# Patient Record
Sex: Male | Born: 1990 | Race: White | Hispanic: No | Marital: Single | Smoking: Never smoker
Health system: Southern US, Community
[De-identification: ages and names within clinical notes are randomized; demographics above are authoritative.]

---
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
## 2013-01-19 ENCOUNTER — Encounter: Payer: Self-pay | Admitting: Family Medicine

## 2013-01-19 ENCOUNTER — Ambulatory Visit (INDEPENDENT_AMBULATORY_CARE_PROVIDER_SITE_OTHER): Payer: 59 | Admitting: Family Medicine

## 2013-01-19 VITALS — BP 120/76 | HR 72 | Temp 98.5°F | Resp 18 | Ht 68.0 in | Wt 200.0 lb

## 2013-01-19 DIAGNOSIS — Z0000 Encounter for general adult medical examination without abnormal findings: Secondary | ICD-10-CM

## 2013-01-19 DIAGNOSIS — Z23 Encounter for immunization: Secondary | ICD-10-CM

## 2013-01-19 NOTE — Progress Notes (Signed)
  Subjective:    Patient ID: Ricky Miller, male    DOB: 1991-06-18, 22 y.o.   MRN: [id number]  HPI Patient seen to establish care. Generally very healthy. No chronic medical problems. Takes no medications. No known allergies. Immunizations reviewed. No history of meningitis vaccine. No documented hepatitis A vaccine.  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 No illicit drug use.  Family history significant for grandparent with type 2 diabetes otherwise unrevealing.  No past medical history on file. No past surgical history on file.  reports that he has never smoked. He has never used smokeless tobacco. He reports that he does not drink alcohol or use illicit drugs. family history is not on file. No Known Allergies    Review of Systems  Constitutional: Negative for fever, activity change, appetite change and fatigue.  HENT: Negative for ear pain, congestion and trouble swallowing.   Eyes: Negative for pain and visual disturbance.  Respiratory: Negative for cough, shortness of breath and wheezing.   Cardiovascular: Negative for chest pain and palpitations.  Gastrointestinal: Negative for nausea, vomiting, abdominal pain, diarrhea, constipation, blood in stool, abdominal distention and rectal pain.  Genitourinary: Negative for dysuria, hematuria and testicular pain.  Musculoskeletal: Negative for joint swelling and arthralgias.  Skin: Negative for rash.  Neurological: Negative for dizziness, syncope and headaches.  Hematological: Negative for adenopathy.  Psychiatric/Behavioral: Negative for confusion and dysphoric mood.       Objective:   Physical Exam  Constitutional: He is oriented to person, place, and time. He appears well-developed and well-nourished. No distress.  HENT:  Head: Normocephalic and atraumatic.  Right Ear: External ear normal.  Left Ear: External ear normal.  Mouth/Throat: Oropharynx is clear and moist.  Eyes: Conjunctivae and EOM are normal.  Pupils are equal, round, and reactive to light.  Neck: Normal range of motion. Neck supple. No thyromegaly present.  Cardiovascular: Normal rate, regular rhythm and normal heart sounds.   No murmur heard. Pulmonary/Chest: No respiratory distress. He has no wheezes. He has no rales.  Abdominal: Soft. Bowel sounds are normal. He exhibits no distension and no mass. There is no tenderness. There is no rebound and no guarding.  Musculoskeletal: He exhibits no edema.  Lymphadenopathy:    He has no cervical adenopathy.  Neurological: He is alert and oriented to person, place, and time. He displays normal reflexes. No cranial nerve deficit.  Skin: No rash noted.  Patient has some scattered nevi on his trunk and has seen dermatologist in the past. None with any severe atypical features  Psychiatric: He has a normal mood and affect.          Assessment & Plan:  Healthy 22 year old male. Would recommend meningitis vaccine. Discussed HPV vaccine and he wishes to wait. Recommend consideration for hepatitis A vaccine especially for any future travel out of country

## 2015-09-29 DIAGNOSIS — H5203 Hypermetropia, bilateral: Secondary | ICD-10-CM | POA: Diagnosis not present

## 2015-09-29 DIAGNOSIS — H52223 Regular astigmatism, bilateral: Secondary | ICD-10-CM | POA: Diagnosis not present

## 2016-09-07 DIAGNOSIS — H52223 Regular astigmatism, bilateral: Secondary | ICD-10-CM | POA: Diagnosis not present

## 2016-09-07 DIAGNOSIS — H5203 Hypermetropia, bilateral: Secondary | ICD-10-CM | POA: Diagnosis not present

## 2017-04-12 DIAGNOSIS — S0502XA Injury of conjunctiva and corneal abrasion without foreign body, left eye, initial encounter: Secondary | ICD-10-CM | POA: Diagnosis not present

## 2017-04-12 MED FILL — TOBRAMYCIN 0.3% EYE DROPS: 0.3 | 5 days supply | Qty: 5 | Fill #0

## 2018-01-10 ENCOUNTER — Encounter: Payer: Self-pay | Admitting: Family Medicine

## 2018-01-10 ENCOUNTER — Ambulatory Visit (INDEPENDENT_AMBULATORY_CARE_PROVIDER_SITE_OTHER): Payer: No Typology Code available for payment source | Admitting: Family Medicine

## 2018-01-10 VITALS — BP 124/82 | HR 84 | Temp 98.3°F | Ht 68.25 in | Wt 218.7 lb

## 2018-01-10 DIAGNOSIS — Z23 Encounter for immunization: Secondary | ICD-10-CM

## 2018-01-10 DIAGNOSIS — Z0000 Encounter for general adult medical examination without abnormal findings: Secondary | ICD-10-CM | POA: Diagnosis not present

## 2018-01-10 LAB — BASIC METABOLIC PANEL
BUN: 11 mg/dL (ref 6–23)
CHLORIDE: 103 meq/L (ref 96–112)
CO2: 31 mEq/L (ref 19–32)
Calcium: 10.1 mg/dL (ref 8.4–10.5)
Creatinine, Ser: 1.08 mg/dL (ref 0.40–1.50)
GFR: 88.9 mL/min (ref >60.00)
Glucose, Bld: 101 mg/dL — ABNORMAL HIGH (ref 70–99)
POTASSIUM: 4.1 meq/L (ref 3.5–5.1)
Sodium: 143 mEq/L (ref 135–145)

## 2018-01-10 LAB — HEPATIC FUNCTION PANEL
ALT: 67 U/L — AB (ref 0–53)
AST: 23 U/L (ref 0–37)
Albumin: 5 g/dL (ref 3.5–5.2)
Alkaline Phosphatase: 76 U/L (ref 39–117)
BILIRUBIN DIRECT: 0.1 mg/dL (ref 0.0–0.3)
BILIRUBIN TOTAL: 0.5 mg/dL (ref 0.2–1.2)
TOTAL PROTEIN: 7.3 g/dL (ref 6.0–8.3)

## 2018-01-10 LAB — CBC WITH DIFFERENTIAL/PLATELET
BASOS ABS: 0 10*3/uL (ref 0.0–0.1)
BASOS PCT: 0.2 % (ref 0.0–3.0)
EOS ABS: 0.1 10*3/uL (ref 0.0–0.7)
Eosinophils Relative: 1.4 % (ref 0.0–5.0)
HEMATOCRIT: 43.3 % (ref 39.0–52.0)
Hemoglobin: 15.3 g/dL (ref 13.0–17.0)
LYMPHS ABS: 2.5 10*3/uL (ref 0.7–4.0)
LYMPHS PCT: 31.1 % (ref 12.0–46.0)
MCHC: 35.5 g/dL (ref 30.0–36.0)
MCV: 84 fl (ref 78.0–100.0)
Monocytes Absolute: 0.5 10*3/uL (ref 0.1–1.0)
Monocytes Relative: 5.6 % (ref 3.0–12.0)
NEUTROS ABS: 4.9 10*3/uL (ref 1.4–7.7)
NEUTROS PCT: 61.7 % (ref 43.0–77.0)
PLATELETS: 258 10*3/uL (ref 150.0–400.0)
RBC: 5.15 Mil/uL (ref 4.22–5.81)
RDW: 12.9 % (ref 11.5–15.5)
WBC: 8 10*3/uL (ref 4.0–10.5)

## 2018-01-10 LAB — LIPID PANEL
CHOL/HDL RATIO: 5
CHOLESTEROL: 201 mg/dL — AB (ref 0–200)
HDL: 37 mg/dL — ABNORMAL LOW (ref >39.00)
NonHDL: 163.54
TRIGLYCERIDES: 252 mg/dL — AB (ref 0.0–149.0)
VLDL: 50.4 mg/dL — AB (ref 0.0–40.0)

## 2018-01-10 LAB — LDL CHOLESTEROL, DIRECT: LDL DIRECT: 118 mg/dL

## 2018-01-10 LAB — TSH: TSH: 1.03 u[IU]/mL (ref 0.35–4.50)

## 2018-01-10 NOTE — Progress Notes (Signed)
Subjective:     Patient ID: Ricky Miller, male   DOB: October 28, 1990, 27 y.o.   MRN: [id number]  HPI Patient seen to establish care and requesting physical. He has plans to get married next June 6. He has no chronic medical problems. Takes no medications. Nonsmoker.  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 Nontender. Fluctuates up and down some in size.  Last tetanus over 10 years ago  Family history reviewed. Maternal grandfather with colon cancer.  Marland Kitchen.  His father has hypertension and hyperlipidemia. No family history of premature CAD  No past medical history on file.   reports that he has never smoked. He has never used smokeless tobacco. He reports that he does not drink alcohol or use drugs. family history includes Cancer in his maternal grandfather; Heart disease in his sister; Hyperlipidemia in his father; Hypertension in his father. No Known Allergies   Review of Systems  Constitutional: Negative for activity change, appetite change, fatigue and fever.  HENT: Negative for congestion, ear pain and trouble swallowing.   Eyes: Negative for pain and visual disturbance.  Respiratory: Negative for cough, shortness of breath and wheezing.   Cardiovascular: Negative for chest pain and palpitations.  Gastrointestinal: Negative for abdominal distention, abdominal pain, blood in stool, constipation, diarrhea, nausea, rectal pain and vomiting.  Genitourinary: Negative for dysuria, hematuria and testicular pain.  Musculoskeletal: Negative for arthralgias and joint swelling.  Skin: Negative for rash.  Neurological: Negative for dizziness, syncope and headaches.  Hematological: Negative for adenopathy.  Psychiatric/Behavioral: Negative for confusion and dysphoric mood.       Objective:   Physical Exam  Constitutional: He is oriented to person, place,  and time. He appears well-developed and well-nourished. No distress.  HENT:  Head: Normocephalic and atraumatic.  Right Ear: External ear normal.  Left Ear: External ear normal.  Mouth/Throat: Oropharynx is clear and moist.  Eyes: Pupils are equal, round, and reactive to light. Conjunctivae and EOM are normal.  Neck: Normal range of motion. Neck supple. No thyromegaly present.  Cardiovascular: Normal rate, regular rhythm and normal heart sounds.  No murmur heard. Pulmonary/Chest: No respiratory distress. He has no wheezes. He has no rales.  Abdominal: Soft. Bowel sounds are normal. He exhibits no distension and no mass. There is no tenderness. There is no rebound and no guarding.  Genitourinary:  Genitourinary Comments: Right testicle is normal. Left testicle is normal. Just superior to the testicle up in epididymis region there is approximately 1/2 cm mobile rounded cystic type lesion  Musculoskeletal: He exhibits no edema.  Lymphadenopathy:    He has no cervical adenopathy.  Neurological: He is alert and oriented to person, place, and time. He displays normal reflexes. No cranial nerve deficit.  Skin: No rash noted.  Psychiatric: He has a normal mood and affect.       Assessment:     Physical exam. Patient is generally healthy. He has very small cystic type lesion which is not attached to the left testicle and is up in epididymis region and may represent small epididymis cyst versus spermatocele    Plan:     -Tetanus booster given -Obtain screening lab work -Watch scrotal cystic lesion closely. If changing in size or any new things such as pain follow-up promptly for further evaluation  Ricky Miller W Ronasia Isola MD [facility]

## 2018-01-11 ENCOUNTER — Other Ambulatory Visit: Payer: Self-pay | Admitting: Family Medicine

## 2018-01-11 DIAGNOSIS — R748 Abnormal levels of other serum enzymes: Secondary | ICD-10-CM

## 2018-05-15 ENCOUNTER — Other Ambulatory Visit: Payer: Self-pay

## 2018-05-22 ENCOUNTER — Ambulatory Visit (INDEPENDENT_AMBULATORY_CARE_PROVIDER_SITE_OTHER): Payer: No Typology Code available for payment source | Admitting: Family Medicine

## 2018-05-22 ENCOUNTER — Encounter: Payer: Self-pay | Admitting: Family Medicine

## 2018-05-22 ENCOUNTER — Other Ambulatory Visit: Payer: Self-pay

## 2018-05-22 VITALS — BP 118/78 | HR 87 | Temp 98.1°F | Ht 68.25 in | Wt 228.3 lb

## 2018-05-22 DIAGNOSIS — N50812 Left testicular pain: Secondary | ICD-10-CM | POA: Diagnosis not present

## 2018-05-22 NOTE — Patient Instructions (Signed)
Increase the Doxycycline to 100 mg po bid.

## 2018-05-22 NOTE — Progress Notes (Signed)
  Subjective:     Patient ID: Ricky Miller, male   DOB: 1991/04/21, 27 y.o.   MRN: [id number]  HPI Patient seen with pain and some swelling left testicle region since last Wednesday.  He has had somewhat intermittent pain.  He went to urgent care over the weekend on Saturday.  States urinalysis was done and he states he had STD screening- presumably GC and chlamydia and was called this morning and  told those were negative.  He denies any injury.  He was placed on doxycycline, Motrin, and Tylenol with codeine.  He has only taken 1 dose of pain medicine and has been taking doxycycline once daily.  No fevers or chills.  He was here back in June and had physical.  At that time he had mobile small cyst that was not attached to the testicle superior pole which felt more like a epididymal cyst.  Current pain and swelling is on the inferior pole.  History reviewed. No pertinent past medical history. History reviewed. No pertinent surgical history.  reports that he has never smoked. He has never used smokeless tobacco. He reports that he does not drink alcohol or use drugs. family history includes Arthritis in his maternal grandfather, maternal grandmother, paternal grandfather, and paternal grandmother; Cancer in his maternal grandfather; Diabetes in his maternal grandmother; Heart disease in his sister; Hyperlipidemia in his father; Hypertension in his father. No Known Allergies   Review of Systems  Constitutional: Negative for chills and fever.  Genitourinary: Positive for testicular pain. Negative for dysuria, frequency, hematuria and penile swelling.       Objective:   Physical Exam  Constitutional: He appears well-developed and well-nourished.  Cardiovascular: Normal rate and regular rhythm.  Pulmonary/Chest: Effort normal and breath sounds normal.  Genitourinary:  Genitourinary Comments: Right testicle is normal.  Left testicle reveals some swelling along the inferior pole and  tenderness to palpation.  No hernia noted       Assessment:     Patient presents with left testicular pain and some swelling along the inferior pole region.  ?acute epididymitis. Needs further evaluation.     Plan:     -Continue doxycycline for now but increase to twice daily -Continue good support briefs -Continue nonsteroidal as needed -Set up ultrasound scrotum for further evaluation -follow up immediately for any fever or worsening symptoms.  Kristian Covey MD [facility]

## 2018-05-23 ENCOUNTER — Other Ambulatory Visit: Payer: No Typology Code available for payment source

## 2018-05-23 ENCOUNTER — Ambulatory Visit
Admission: RE | Admit: 2018-05-23 | Discharge: 2018-05-23 | Disposition: A | Discharge status: Home / Self Care | Payer: No Typology Code available for payment source | Source: Ambulatory Visit | Attending: Family Medicine | Admitting: Family Medicine

## 2018-05-23 DIAGNOSIS — N50812 Left testicular pain: Secondary | ICD-10-CM

## 2018-05-26 ENCOUNTER — Other Ambulatory Visit: Payer: No Typology Code available for payment source

## 2019-03-10 ENCOUNTER — Telehealth: Payer: Self-pay | Admitting: *Deleted

## 2019-03-10 NOTE — Telephone Encounter (Signed)
Patient called after hours line today. Patient reports he was exposed to the [place] D and wants to make an appt to have the test. 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 No Sx. Declined triage.

## 2019-03-12 ENCOUNTER — Other Ambulatory Visit: Payer: Self-pay

## 2019-03-12 ENCOUNTER — Telehealth (INDEPENDENT_AMBULATORY_CARE_PROVIDER_SITE_OTHER): Payer: No Typology Code available for payment source | Admitting: Family Medicine

## 2019-03-12 DIAGNOSIS — Z20828 Contact with and (suspected) exposure to other viral communicable diseases: Secondary | ICD-10-CM | POA: Diagnosis not present

## 2019-03-12 DIAGNOSIS — R0981 Nasal congestion: Secondary | ICD-10-CM

## 2019-03-12 DIAGNOSIS — Z20822 Contact with and (suspected) exposure to covid-19: Secondary | ICD-10-CM

## 2019-03-12 NOTE — Telephone Encounter (Signed)
Called patient and I have set him up for a Doxy appointment today at 4:15pm. I also gave him the [facility] site to go get tested today since he started having a headache and congestion and nasal discharge. Patient is quarantined and stated that he is using precaution.

## 2019-03-12 NOTE — Patient Instructions (Signed)

## 2019-03-12 NOTE — Progress Notes (Signed)
This visit type was conducted due to national recommendations for restrictions regarding the COVID-19 pandemic in an effort to limit this patient's exposure and mitigate transmission in our community.   Virtual Visit via Video Note  I connected with Gaspar Skeeters on 03/12/19 at  4:15 PM EDT by a video enabled telemedicine application and verified that I am speaking with the correct person using two identifiers.  Location patient: home Location provider:work or home office Persons participating in the virtual visit: patient, provider  I discussed the limitations of evaluation and management by telemedicine and the availability of in person appointments. The patient expressed understanding and agreed to proceed.   HPI: Patient had recent COVID exposure.  His sister whom he was around for several days in his house did test positive.  Patient relates couple day history of some intermittent headache, nasal congestion, fatigue.  No documented fever.  No body aches.  Denies any significant cough.  No dyspnea.  No diarrhea symptoms.  No loss of taste or smell.  Patient actually has already been tested earlier today at local test center.  He does not have any other significant comorbidities.  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   ROS: See pertinent positives and negatives per HPI.  No past medical history on file.  No past surgical history on file.  Family History  Problem Relation Age of Onset  . Hyperlipidemia Father   . Hypertension Father   . Heart disease Sister   . Cancer Maternal Grandfather        colon cancer.  . Arthritis Maternal Grandfather   . Arthritis Maternal Grandmother   . Diabetes Maternal Grandmother   . Arthritis Paternal Grandmother   . Arthritis Paternal Grandfather     SOCIAL HX: Non-smoker   Current Outpatient Medications:  .  acetaminophen-codeine (TYLENOL #3) 300-30 MG tablet, , Disp: , Rfl:  .  doxycycline (ADOXA) 100 MG  tablet, , Disp: , Rfl:  .  ibuprofen (ADVIL,MOTRIN) 800 MG tablet, , Disp: , Rfl:   EXAM:  VITALS per patient if applicable:  GENERAL: alert, oriented, appears well and in no acute distress  HEENT: atraumatic, conjunttiva clear, no obvious abnormalities on inspection of external nose and ears  NECK: normal movements of the head and neck  LUNGS: on inspection no signs of respiratory distress, breathing rate appears normal, no obvious gross SOB, gasping or wheezing  CV: no obvious cyanosis  MS: moves all visible extremities without noticeable abnormality  PSYCH/NEURO: pleasant and cooperative, no obvious depression or anxiety, speech and thought processing grossly intact  ASSESSMENT AND PLAN:  Discussed the following assessment and plan:  Positive COVID exposure with patient having symptoms above.  He is in no respiratory distress.  High risk for COVID infection given circumstances  -COVID test earlier today -Plenty of fluids and rest and patient knows importance of staying quarantined until further information -Follow-up promptly for any increased shortness of breath or other concerns    I discussed the assessment and treatment plan with the patient. The patient was provided an opportunity to ask questions and all were answered. The patient agreed with the plan and demonstrated an understanding of the instructions.   The patient was advised to call back or seek an in-person evaluation if the symptoms worsen or if the condition fails to improve as anticipated.   Carolann Littler, MD

## 2019-03-14 LAB — NOVEL CORONAVIRUS, NAA: SARS-CoV-2, NAA: NOT DETECTED

## 2019-04-05 DIAGNOSIS — N4341 Spermatocele of epididymis, single: Secondary | ICD-10-CM | POA: Diagnosis not present

## 2019-06-15 DIAGNOSIS — H52223 Regular astigmatism, bilateral: Secondary | ICD-10-CM | POA: Diagnosis not present

## 2019-06-15 DIAGNOSIS — Z135 Encounter for screening for eye and ear disorders: Secondary | ICD-10-CM | POA: Diagnosis not present

## 2019-12-26 IMAGING — US US SCROTUM W/ DOPPLER COMPLETE
2 series · 14 of 25 positions shown · non-contrast
Comparison: None.

CLINICAL DATA: Left testicular pain for 1 week

EXAM:
SCROTAL ULTRASOUND
DOPPLER ULTRASOUND OF THE TESTICLES
TECHNIQUE: Complete ultrasound examination of the testicles, epididymis, and
other scrotal structures was performed. Color and spectral Doppler
ultrasound were also utilized to evaluate blood flow to the
testicles.

[Series 1: us scrotum w/ doppler complete · 0.08mm/px · 13 of 44 slices shown (1 of 2)]
[im 1/44]
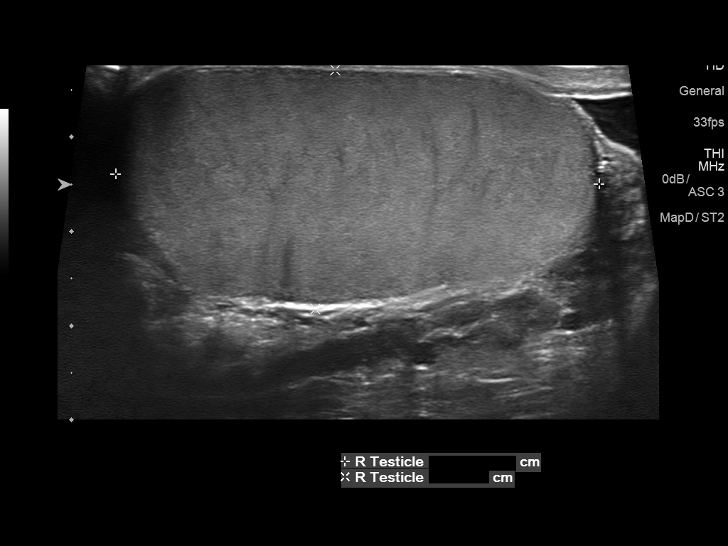
[im 4/44]
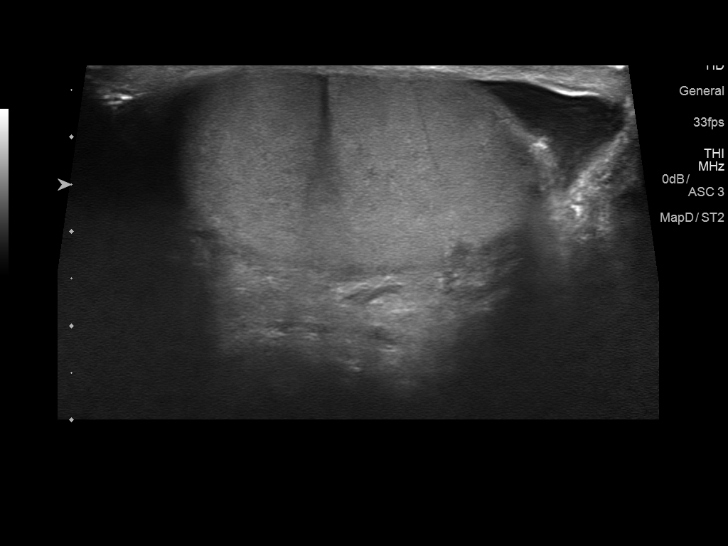
[im 8/44]
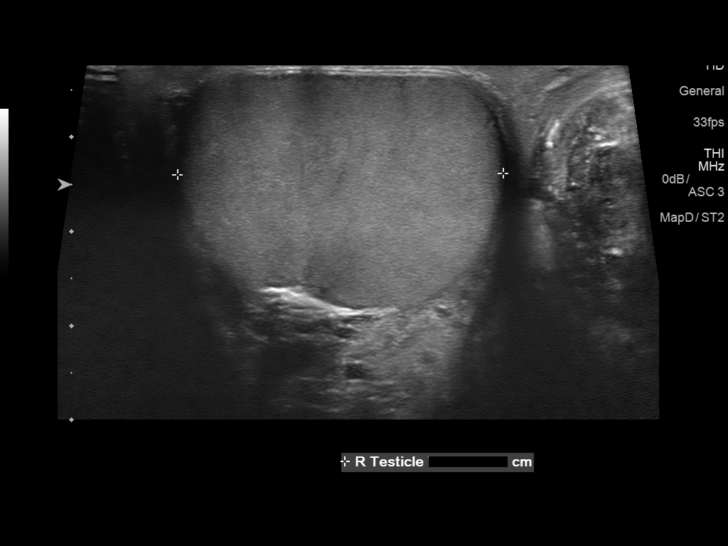
[im 12/44]
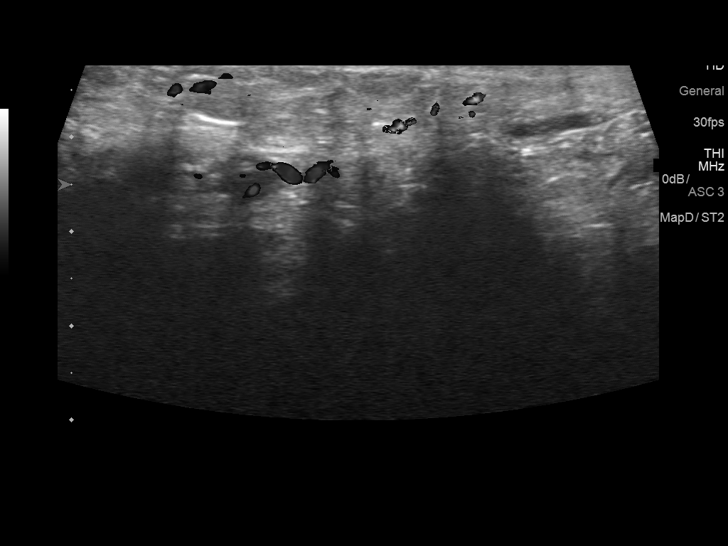
[im 15/44]
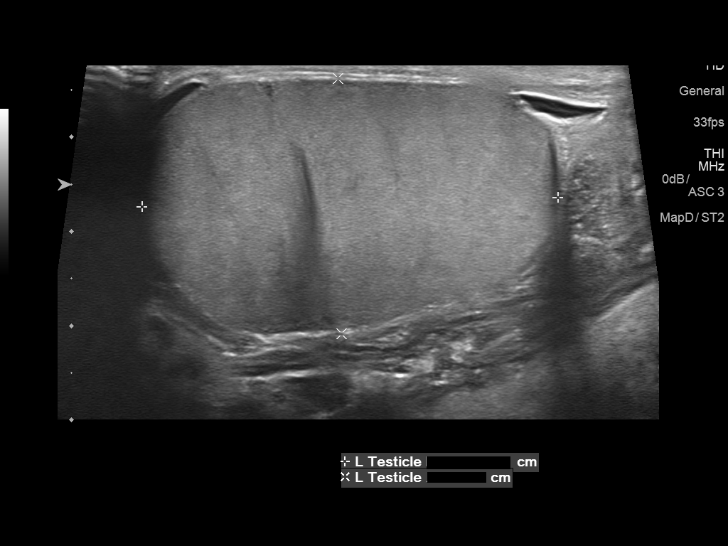
[im 17/44]
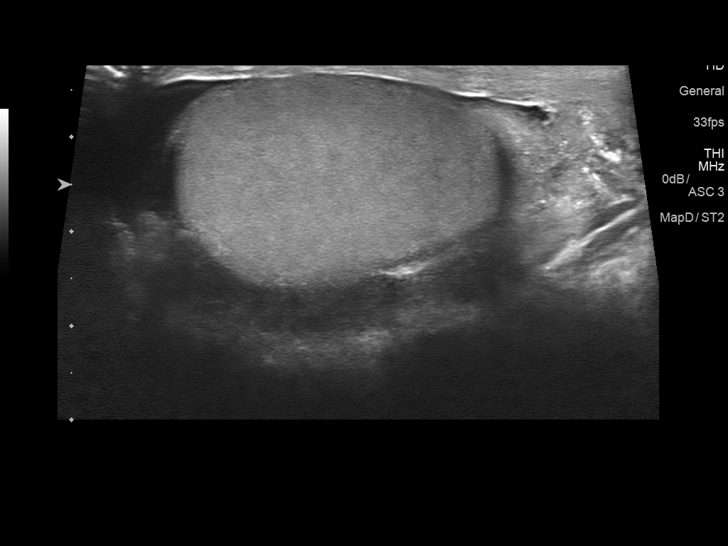
[im 21/44]
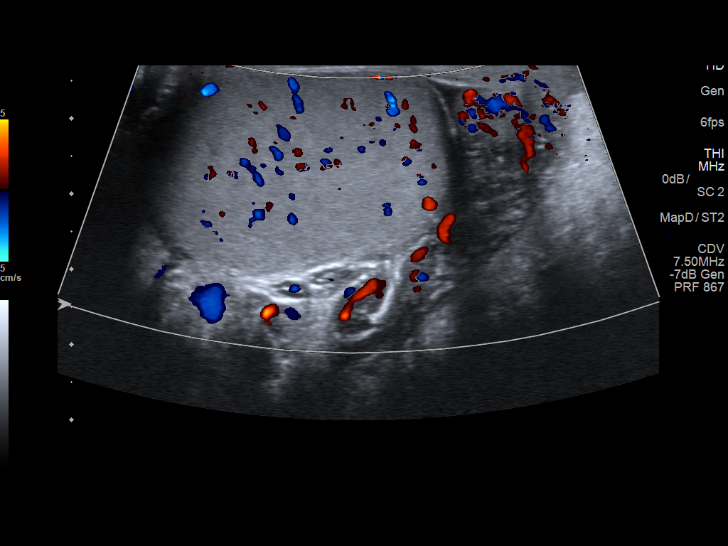
[im 25/44]
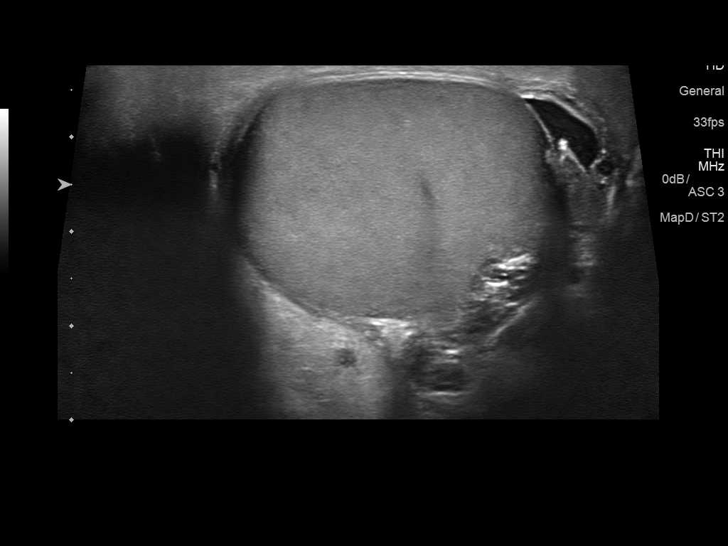
[im 29/44]
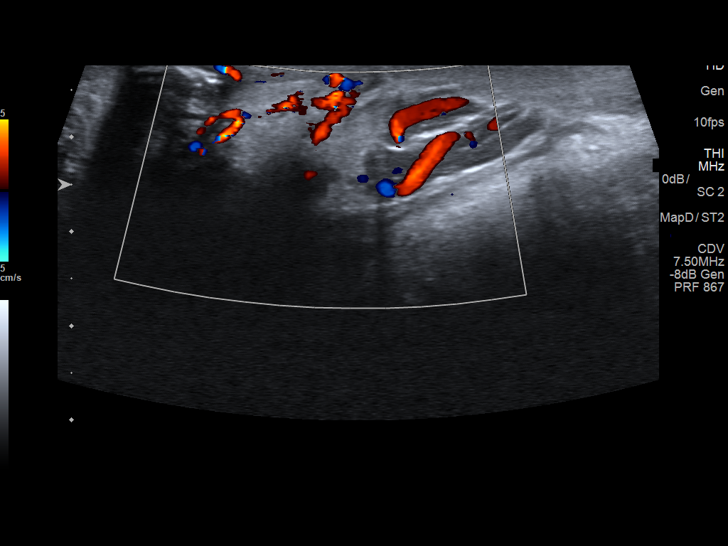
[im 30/44]
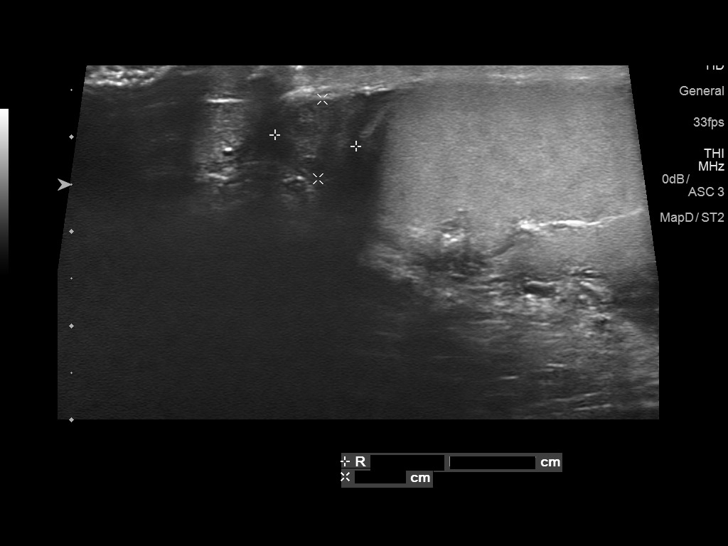
[im 34/44]
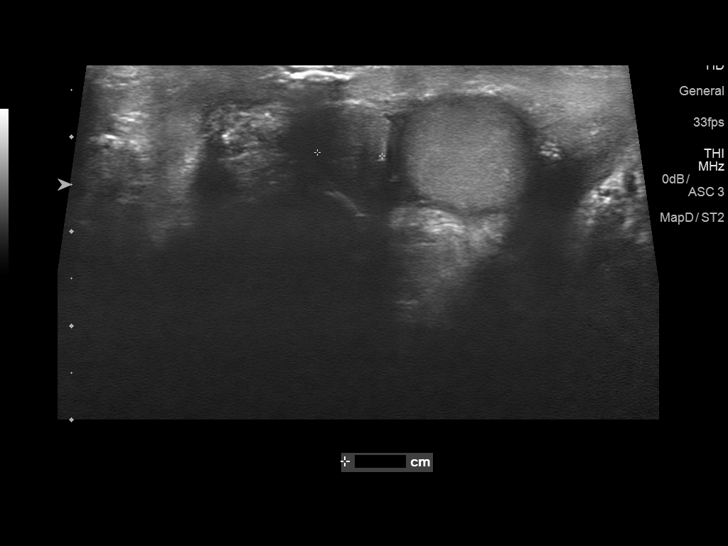
[im 38/44]
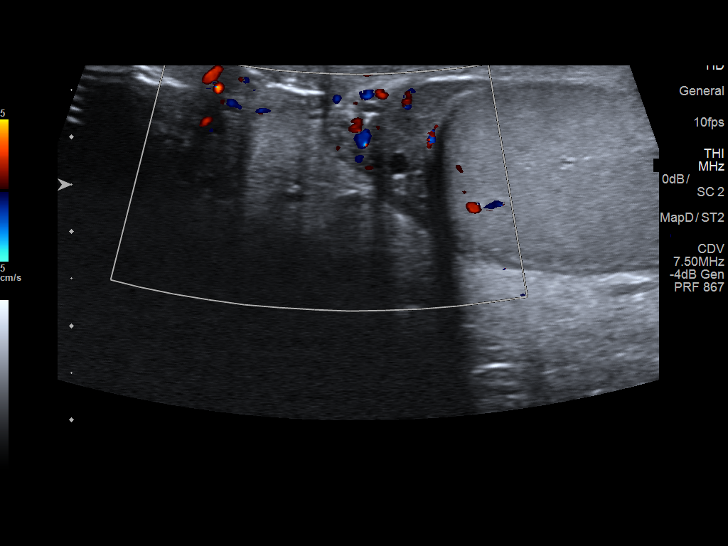
[im 42/44]
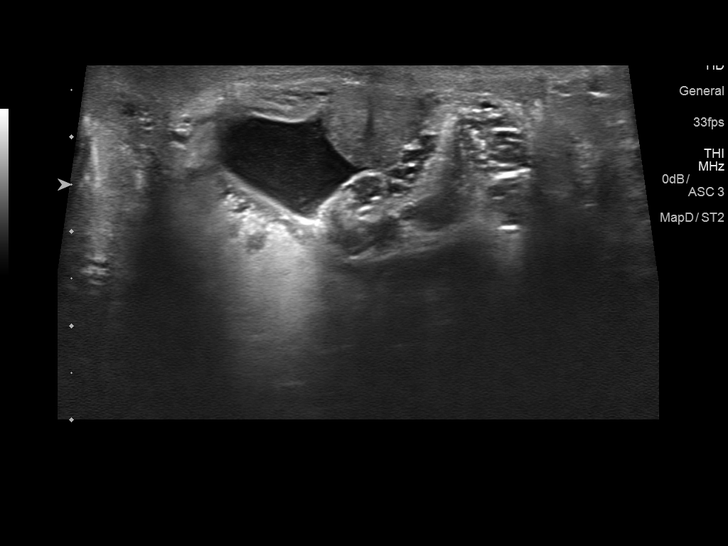

[Series 2001: us scrotum w/ doppler complete · 0.08mm/px · 1 of 2 slices shown (2 of 2)]
[im 1/2]
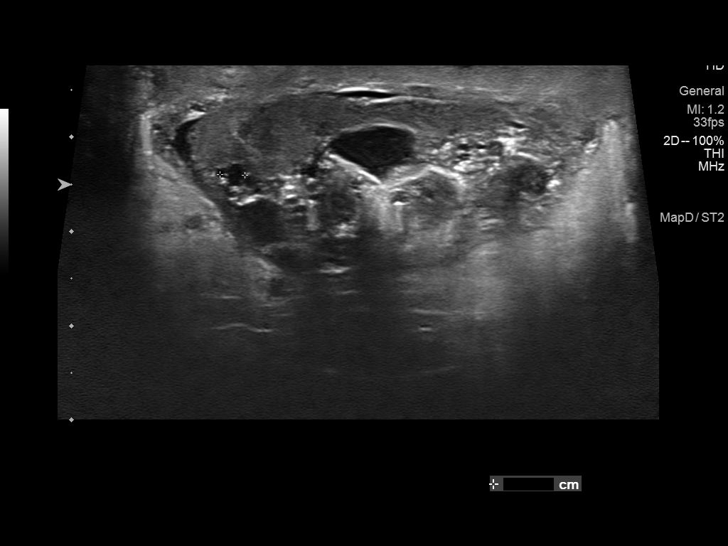

[14 of 25 positions shown; findings below may reference images not displayed]

FINDINGS: Right testicle

Measurements: 5.1 x 2.5 x 3.5 cm.. No mass or microlithiasis
visualized.

Left testicle

Measurements: 4.4 x 2.7 x 3.4 cm.. No mass or microlithiasis
visualized.

Right epididymis:  Normal in size and appearance.

Left epididymis: Small 3 mm cyst is noted within the left
epididymis.

Hydrocele:  Small left hydrocele is noted.

Varicocele:  None visualized.

Pulsed Doppler interrogation of both testes demonstrates normal low
resistance arterial and venous waveforms bilaterally.
IMPRESSION: Small left hydrocele

Small left epididymal cyst.

Normal-appearing testicles bilaterally.

## 2020-08-07 ENCOUNTER — Other Ambulatory Visit: Payer: Self-pay

## 2020-08-08 ENCOUNTER — Encounter: Payer: Self-pay | Admitting: Family Medicine

## 2020-08-08 ENCOUNTER — Ambulatory Visit (INDEPENDENT_AMBULATORY_CARE_PROVIDER_SITE_OTHER): Payer: Self-pay | Admitting: Family Medicine

## 2020-08-08 VITALS — BP 118/60 | HR 89 | Resp 16 | Ht 68.25 in | Wt 162.0 lb

## 2020-08-08 DIAGNOSIS — R41840 Attention and concentration deficit: Secondary | ICD-10-CM

## 2020-08-08 DIAGNOSIS — R634 Abnormal weight loss: Secondary | ICD-10-CM

## 2020-08-08 NOTE — Progress Notes (Signed)
Established Patient Office Visit  Subjective:  Patient ID: Ricky Miller, male    DOB: April 05, 1991  Age: 30 y.o. MRN: [id number]  CC:  Chief Complaint  Patient presents with  . Weight Loss  . Anxiety    HPI Ricky Miller presents for weight loss over the past year and mostly to discuss difficulties focusing and concentrating.   He weighed 218 pounds at physical 01/10/2018.  He has lost down to current weight of 162 pounds.  It sounds like some of this at least if not most of this was intentional.  He made some changes in diet.  Generally feels good overall.  Does have some days of suppressed appetite but overall appetite is fair.  He denies any recurrent nausea or vomiting, diarrhea, abdominal pain, skin rashes, night sweats, adenopathy, headaches.  Does have occasional tremors.  No palpitations.  Has not had any labs since his physical back in 2019.  Takes no regular medications.  Non-smoker.  Other issue is he states pretty much most of his life he had difficulty concentrating.  Easy distractibility.  Has generally done poorly in school.  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  He states when he reads passages he frequently has to reread these.  He has sometimes difficulty completing task that require a lot of attention to detail.  He finds it easy to overlook details.  Never tested for ADD.  No known learning disabilities.  No past medical history on file.  No past surgical history on file.  Family History  Problem Relation Age of Onset  . Hyperlipidemia Father   . Hypertension Father   . Heart disease Sister   . Cancer Maternal Grandfather        colon cancer.  . Arthritis Maternal Grandfather   . Arthritis Maternal Grandmother   . Diabetes Maternal Grandmother   . Arthritis Paternal Grandmother   . Arthritis Paternal Grandfather     Social History   Socioeconomic  History  . Marital status: Single    Spouse name: Not on file  . Number of children: Not on file  . Years of education: Not on file  . Highest education level: Not on file  Occupational History  . Not on file  Tobacco Use  . Smoking status: Never Smoker  . Smokeless tobacco: Never Used  Vaping Use  . Vaping Use: Never used  Substance and Sexual Activity  . Alcohol use: No  . Drug use: No  . Sexual activity: Not on file  Other Topics Concern  . Not on file  Social History Narrative  . Not on file   Social Determinants of Health   Financial Resource Strain: Not on file  Food Insecurity: Not on file  Transportation Needs: Not on file  Physical Activity: Not on file  Stress: Not on file  Social Connections: Not on file  Intimate Partner Violence: Not on file    Outpatient Medications Prior to Visit  Medication Sig Dispense Refill  . acetaminophen-codeine (TYLENOL #3) 300-30 MG tablet     . doxycycline (ADOXA) 100 MG tablet     . ibuprofen (ADVIL,MOTRIN) 800 MG tablet      No facility-administered medications prior to visit.    No Known Allergies  ROS Review of Systems  Constitutional: Positive for unexpected weight change. Negative for appetite change, chills and fever.  Respiratory: Negative for cough  and shortness of breath.   Cardiovascular: Negative for chest pain.  Gastrointestinal: Negative for abdominal pain, blood in stool, diarrhea, nausea and vomiting.  Genitourinary: Negative for dysuria.  Musculoskeletal: Negative for back pain.  Skin: Negative for rash.  Neurological: Negative for headaches.  Hematological: Negative for adenopathy.      Objective:    Physical Exam Vitals reviewed.  Constitutional:      Appearance: Normal appearance.  Cardiovascular:     Rate and Rhythm: Normal rate and regular rhythm.  Pulmonary:     Effort: Pulmonary effort is normal.     Breath sounds: Normal breath sounds.  Abdominal:     Palpations: Abdomen is soft.  There is no mass.     Tenderness: There is no abdominal tenderness.  Musculoskeletal:     Cervical back: Neck supple.  Lymphadenopathy:     Cervical: No cervical adenopathy.  Skin:    Findings: No rash.  Neurological:     General: No focal deficit present.     Mental Status: He is alert.     Cranial Nerves: No cranial nerve deficit.  Psychiatric:        Mood and Affect: Mood normal.        Thought Content: Thought content normal.     BP 118/60 (BP Location: Right Arm, Patient Position: Sitting, Cuff Size: Normal)   Pulse 89   Resp 16   Ht 5' 8.25" (1.734 m)   Wt 162 lb (73.5 kg)   SpO2 98%   BMI 24.45 kg/m  Wt Readings from Last 3 Encounters:  08/08/20 162 lb (73.5 kg)  05/22/18 228 lb 4.8 oz (103.6 kg)  01/10/18 218 lb 11.2 oz (99.2 kg)     Health Maintenance Due  Topic Date Due  . Hepatitis C Screening  Never done  . COVID-19 Vaccine (1) Never done  . HIV Screening  Never done  . INFLUENZA VACCINE  02/24/2020    There are no preventive care reminders to display for this patient.  Lab Results  Component Value Date   TSH 1.03 01/10/2018   Lab Results  Component Value Date   WBC 8.0 01/10/2018   HGB 15.3 01/10/2018   HCT 43.3 01/10/2018   MCV 84.0 01/10/2018   PLT 258.0 01/10/2018   Lab Results  Component Value Date   NA 143 01/10/2018   K 4.1 01/10/2018   CO2 31 01/10/2018   GLUCOSE 101 (H) 01/10/2018   BUN 11 01/10/2018   CREATININE 1.08 01/10/2018   BILITOT 0.5 01/10/2018   ALKPHOS 76 01/10/2018   AST 23 01/10/2018   ALT 67 (H) 01/10/2018   PROT 7.3 01/10/2018   ALBUMIN 5.0 01/10/2018   CALCIUM 10.1 01/10/2018   GFR 88.90 01/10/2018   Lab Results  Component Value Date   CHOL 201 (H) 01/10/2018   Lab Results  Component Value Date   HDL 37.00 (L) 01/10/2018   No results found for: [facility] Lab Results  Component Value Date   TRIG 252.0 (H) 01/10/2018   Lab Results  Component Value Date   CHOLHDL 5 01/10/2018   No results found  for: HGBA1C    Assessment & Plan:   #1 weight loss.  He has had tremendous weight loss of about 60 pounds over the past couple years.  Sounds like this has been largely intentional due to dietary changes but not clear that he would lose to this extent.  Encouraging is the fact that he has generally a good appetite.  -  We have recommended further lab evaluation with CBC, TSH, comprehensive metabolic panel, sed rate.  We wrote out order as he is not sure if his current insurance will cover and he wishes to try to get labs back in [place].  He currently lives in [facility] [place] [facility] [place]. -We have asked that he try to get those lab results faxed back here  #2 concern for possible attention deficit disorder.  We recommended further testing to help sort out.  He was given name and number for our behavioral health division and he will try to set this up.  He may consider being evaluated closer to home.  No orders of the defined types were placed in this encounter.   Follow-up: No follow-ups on file.    Evelena Peat, MD

## 2020-08-19 ENCOUNTER — Encounter: Payer: Self-pay | Admitting: Family Medicine

## 2020-12-30 ENCOUNTER — Telehealth: Payer: Self-pay | Admitting: Family Medicine

## 2020-12-30 ENCOUNTER — Encounter: Payer: Self-pay | Admitting: Family Medicine

## 2020-12-30 DIAGNOSIS — F988 Other specified behavioral and emotional disorders with onset usually occurring in childhood and adolescence: Secondary | ICD-10-CM

## 2020-12-30 NOTE — Telephone Encounter (Signed)
Patients mother is calling and stated that provider wanted patient  to get adult ADD testing and patient now lives in [place], [place]. Mother wanted to see if the provider can fax referral to [phone] [facility] [address] [place], [place] [address]. CB is [phone]

## 2020-12-30 NOTE — Telephone Encounter (Signed)
Referral has been placed. See mychart message for further documentation.

## 2022-08-05 DIAGNOSIS — H9202 Otalgia, left ear: Secondary | ICD-10-CM | POA: Diagnosis not present

## 2022-08-05 DIAGNOSIS — R0982 Postnasal drip: Secondary | ICD-10-CM | POA: Diagnosis not present
